# Patient Record
Sex: Male | Born: 1995
Health system: Southern US, Community
[De-identification: ages and names within clinical notes are randomized; demographics above are authoritative.]

## PROBLEM LIST (undated history)

## (undated) DIAGNOSIS — F988 Other specified behavioral and emotional disorders with onset usually occurring in childhood and adolescence: Secondary | ICD-10-CM

## (undated) DIAGNOSIS — F909 Attention-deficit hyperactivity disorder, unspecified type: Secondary | ICD-10-CM

## (undated) HISTORY — PX: APPENDECTOMY: SHX54

## (undated) HISTORY — PX: APPENDECTOMY (OPEN): SHX54

## (undated) HISTORY — DX: Attention-deficit hyperactivity disorder, unspecified type: F90.9

---

## 2017-07-24 DIAGNOSIS — Z5181 Encounter for therapeutic drug level monitoring: Secondary | ICD-10-CM | POA: Diagnosis not present

## 2017-07-24 DIAGNOSIS — Z79899 Other long term (current) drug therapy: Secondary | ICD-10-CM | POA: Diagnosis not present

## 2017-07-24 DIAGNOSIS — F988 Other specified behavioral and emotional disorders with onset usually occurring in childhood and adolescence: Secondary | ICD-10-CM | POA: Diagnosis not present

## 2017-08-21 DIAGNOSIS — F988 Other specified behavioral and emotional disorders with onset usually occurring in childhood and adolescence: Secondary | ICD-10-CM | POA: Diagnosis not present

## 2017-10-01 DIAGNOSIS — F988 Other specified behavioral and emotional disorders with onset usually occurring in childhood and adolescence: Secondary | ICD-10-CM | POA: Diagnosis not present

## 2017-10-01 DIAGNOSIS — Z5181 Encounter for therapeutic drug level monitoring: Secondary | ICD-10-CM | POA: Diagnosis not present

## 2018-01-08 DIAGNOSIS — Z5181 Encounter for therapeutic drug level monitoring: Secondary | ICD-10-CM | POA: Diagnosis not present

## 2018-01-08 DIAGNOSIS — F988 Other specified behavioral and emotional disorders with onset usually occurring in childhood and adolescence: Secondary | ICD-10-CM | POA: Diagnosis not present

## 2018-07-30 DIAGNOSIS — Z0000 Encounter for general adult medical examination without abnormal findings: Secondary | ICD-10-CM | POA: Diagnosis not present

## 2018-07-30 DIAGNOSIS — Z5181 Encounter for therapeutic drug level monitoring: Secondary | ICD-10-CM | POA: Diagnosis not present

## 2018-07-30 DIAGNOSIS — F988 Other specified behavioral and emotional disorders with onset usually occurring in childhood and adolescence: Secondary | ICD-10-CM | POA: Diagnosis not present

## 2018-08-06 DIAGNOSIS — Z0000 Encounter for general adult medical examination without abnormal findings: Secondary | ICD-10-CM | POA: Diagnosis not present

## 2018-08-06 DIAGNOSIS — F988 Other specified behavioral and emotional disorders with onset usually occurring in childhood and adolescence: Secondary | ICD-10-CM | POA: Diagnosis not present

## 2018-12-15 ENCOUNTER — Other Ambulatory Visit: Payer: Self-pay

## 2018-12-15 ENCOUNTER — Emergency Department (HOSPITAL_COMMUNITY)
Admission: EM | Admit: 2018-12-15 | Discharge: 2018-12-15 | Disposition: A | Discharge status: Home / Self Care | Payer: Federal, State, Local not specified - PPO | Attending: Emergency Medicine | Admitting: Emergency Medicine

## 2018-12-15 ENCOUNTER — Encounter (HOSPITAL_COMMUNITY): Payer: Self-pay

## 2018-12-15 ENCOUNTER — Emergency Department (HOSPITAL_COMMUNITY): Payer: Federal, State, Local not specified - PPO

## 2018-12-15 DIAGNOSIS — J111 Influenza due to unidentified influenza virus with other respiratory manifestations: Secondary | ICD-10-CM | POA: Insufficient documentation

## 2018-12-15 DIAGNOSIS — R05 Cough: Secondary | ICD-10-CM | POA: Diagnosis not present

## 2018-12-15 DIAGNOSIS — R509 Fever, unspecified: Secondary | ICD-10-CM | POA: Diagnosis not present

## 2018-12-15 HISTORY — DX: Other specified behavioral and emotional disorders with onset usually occurring in childhood and adolescence: F98.8

## 2018-12-15 MED ORDER — OSELTAMIVIR PHOSPHATE 75 MG PO CAPS: 75.0000 mg | ORAL_CAPSULE | Freq: Two times a day (BID) | ORAL | 0 refills | Status: AC

## 2018-12-15 MED ORDER — BENZONATATE 100 MG PO CAPS: 100.0000 mg | ORAL_CAPSULE | Freq: Three times a day (TID) | ORAL | 0 refills | Status: AC

## 2018-12-15 MED ORDER — IBUPROFEN 200 MG PO TABS
400.0000 mg | ORAL_TABLET | Freq: Once | ORAL | Status: AC
  Administered 2018-12-15: 400 mg via ORAL
  Filled 2018-12-15: qty 2

## 2018-12-15 MED ORDER — ALBUTEROL SULFATE HFA 108 (90 BASE) MCG/ACT IN AERS
2.0000 | INHALATION_SPRAY | RESPIRATORY_TRACT | Status: DC | PRN
  Administered 2018-12-15: 2 via RESPIRATORY_TRACT
  Filled 2018-12-15: qty 6.7

## 2018-12-15 NOTE — ED Provider Notes (Signed)
[facility]-EMERGENCY DEPT Provider Note   CSN: [id number] Arrival date & time: 12/15/18  1220     History   Chief Complaint Chief Complaint  Patient presents with  . Generalized Body Aches  . Cough  . Fever    HPI Todd Nicholson is a 22 y.o. male who presents to the ED with flu like symptoms. Patient reports the symptoms started 2 days ago and have gotten worse with fever, chills and cough. Patient taking OTC medications without relief. Patient did not have a flu vaccine this year.   The history is provided by the patient. No language interpreter was used.  Influenza  Presenting symptoms: cough, fever, headache, myalgias, nausea, rhinorrhea and sore throat   Presenting symptoms: no diarrhea, no shortness of breath and no vomiting   Severity:  Moderate Onset quality:  Gradual Duration:  2 days Progression:  Worsening Chronicity:  New Relieved by:  Nothing Worsened by:  Nothing Ineffective treatments:  OTC medications Associated symptoms: chills, decreased appetite, ear pain and nasal congestion   Associated symptoms: no neck stiffness     Past Medical History:  Diagnosis Date  . ADD (attention deficit disorder)     There are no active problems to display for this patient.   Past Surgical History:  Procedure Laterality Date  . APPENDECTOMY          Home Medications    Prior to Admission medications   Medication Sig Start Date End Date Taking? Authorizing Provider  benzonatate (TESSALON) 100 MG capsule Take 1 capsule (100 mg total) by mouth every 8 (eight) hours. 12/15/18   Janne NapoleonNeese, Shakela Donati M, NP  oseltamivir (TAMIFLU) 75 MG capsule Take 1 capsule (75 mg total) by mouth every 12 (twelve) hours. 12/15/18   Janne NapoleonNeese, Tamika Shropshire M, NP    Family History Family History  Problem Relation Age of Onset  . Healthy Mother   . Healthy Father     Social History Social History   Tobacco Use  . Smoking status: Never Smoker  . Smokeless tobacco: Never  Used  Substance Use Topics  . Alcohol use: Yes    Comment: occasionally  . Drug use: Yes    Types: Marijuana     Allergies   Patient has no known allergies.   Review of Systems Review of Systems  Constitutional: Positive for chills, decreased appetite and fever.  HENT: Positive for congestion, ear pain, rhinorrhea and sore throat.   Eyes: Negative for pain, discharge and itching.  Respiratory: Positive for cough and wheezing. Negative for shortness of breath.   Cardiovascular: Negative for chest pain.  Gastrointestinal: Positive for nausea. Negative for abdominal pain, diarrhea and vomiting.  Genitourinary: Negative for decreased urine volume, dysuria, frequency and urgency.  Musculoskeletal: Positive for myalgias. Negative for neck stiffness.  Skin: Negative for rash.  Neurological: Positive for headaches. Negative for syncope.  Hematological: Negative for adenopathy.  Psychiatric/Behavioral: Negative for confusion.     Physical Exam Updated Vital Signs BP 128/72 (BP Location: Left Arm)   Pulse 99   Temp (!) 100.9 F (38.3 C) (Oral)   Resp 18   Ht 5\' 11"  (1.803 m)   Wt 74.8 kg   SpO2 97%   BMI 23.01 kg/m   Physical Exam Vitals signs and nursing note reviewed.  Constitutional:      General: He is not in acute distress.    Appearance: He is well-developed.  HENT:     Head: Normocephalic and atraumatic.  Right Ear: Tympanic membrane normal.     Left Ear: Tympanic membrane normal.     Nose: Mucosal edema and congestion present.     Right Turbinates: Swollen.     Left Turbinates: Swollen.     Mouth/Throat:     Mouth: Mucous membranes are moist.     Pharynx: Oropharynx is clear. Uvula midline.  Neck:     Musculoskeletal: Neck supple.  Cardiovascular:     Rate and Rhythm: Normal rate and regular rhythm.  Pulmonary:     Effort: No respiratory distress.     Breath sounds: No rales. Wheezes: occasional.  Abdominal:     Palpations: Abdomen is soft.      Tenderness: There is no abdominal tenderness.  Musculoskeletal: Normal range of motion.  Skin:    General: Skin is warm and dry.  Neurological:     Mental Status: He is alert and oriented to person, place, and time.  Psychiatric:        Mood and Affect: Mood normal.      ED Treatments / Results  Labs (all labs ordered are listed, but only abnormal results are displayed) Labs Reviewed - No data to display  Radiology Dg Chest 2 View  Result Date: 12/15/2018 CLINICAL DATA:  Productive cough for 2-3 days, smoker, vaping EXAM: CHEST - 2 VIEW COMPARISON:  None FINDINGS: Normal heart size, mediastinal contours, and pulmonary vascularity. Lungs clear. No pleural effusion or pneumothorax. Bones unremarkable. IMPRESSION: Normal exam. Electronically Signed   By: Ulyses SouthwardMark  Boles M.D.   On: 12/15/2018 14:17    Procedures Procedures (including critical care time)  Medications Ordered in ED Medications  albuterol (PROVENTIL HFA;VENTOLIN HFA) 108 (90 Base) MCG/ACT inhaler 2 puff (2 puffs Inhalation Given 12/15/18 1308)  ibuprofen (ADVIL,MOTRIN) tablet 400 mg (400 mg Oral Given 12/15/18 1355)     Initial Impression / Assessment and Plan / ED Course  I have reviewed the triage vital signs and the nursing notes. SUBJECTIVE:  Todd Nicholson is a [age] y.o. male who present complaining of flu-like symptoms: fevers, chills, myalgias, congestion, sore throat and cough for 2 days. Denies dyspnea. CXR show no pneumonia or other acute findings.  OBJECTIVE: Appears moderately ill but not toxic; temperature as noted in vitals. Ears normal. Throat and pharynx normal.  Neck supple. No adenopathy in the neck. Sinuses non tender. The chest is clear.  ASSESSMENT: Influenza  PLAN: albuterol inhaler given prior to d/c to use 2 puffs every 4 hours as needed. Will start Tamiflu and Symptomatic therapy suggested: rest, increase fluids, tylenol and ibuprofen for fever and body aches, gargle prn for sore throat and  use mist of vaporizer prn. Follow up with PCP or return here for worsening symptoms.   Final Clinical Impressions(s) / ED Diagnoses   Final diagnoses:  Influenza    ED Discharge Orders         Ordered    benzonatate (TESSALON) 100 MG capsule  Every 8 hours     12/15/18 1443    oseltamivir (TAMIFLU) 75 MG capsule  Every 12 hours     12/15/18 1443           Damian Leavelleese, BerwynHope M, NP 12/15/18 1451    Lorre NickAllen, Anthony, MD 12/16/18 [phone]

## 2018-12-15 NOTE — Discharge Instructions (Addendum)
Take tylenol and ibuprofen as needed for fever and body aches. Follow up with your doctor or return here for worsening symptoms. Be sure you are drinking plenty of fluids to prevent dehydration.

## 2018-12-15 NOTE — ED Notes (Signed)
Bed: WTR8 Expected date:  Expected time:  Means of arrival:  Comments: 

## 2019-02-13 ENCOUNTER — Emergency Department
Admission: EM | Admit: 2019-02-13 | Discharge: 2019-02-13 | Disposition: A | Discharge status: Home / Self Care | Payer: BLUE CROSS/BLUE SHIELD | Attending: Emergency Medicine | Admitting: Emergency Medicine

## 2019-02-13 DIAGNOSIS — R112 Nausea with vomiting, unspecified: Secondary | ICD-10-CM | POA: Insufficient documentation

## 2019-02-13 DIAGNOSIS — R197 Diarrhea, unspecified: Secondary | ICD-10-CM | POA: Insufficient documentation

## 2019-02-13 DIAGNOSIS — E860 Dehydration: Secondary | ICD-10-CM | POA: Insufficient documentation

## 2019-02-13 LAB — CBC AND DIFFERENTIAL
Absolute NRBC: 0 10*3/uL (ref 0.00–0.00)
Basophils Absolute Automated: 0.05 10*3/uL (ref 0.00–0.08)
Basophils Automated: 0.4 %
Eosinophils Absolute Automated: 0.02 10*3/uL (ref 0.00–0.44)
Eosinophils Automated: 0.2 %
Hematocrit: 50.2 % — ABNORMAL HIGH (ref 37.6–49.6)
Hgb: 17.3 g/dL — ABNORMAL HIGH (ref 12.5–17.1)
Immature Granulocytes Absolute: 0.06 10*3/uL (ref 0.00–0.07)
Immature Granulocytes: 0.5 %
Lymphocytes Absolute Automated: 0.21 10*3/uL — ABNORMAL LOW (ref 0.42–3.22)
Lymphocytes Automated: 1.6 %
MCH: 30.5 pg (ref 25.1–33.5)
MCHC: 34.5 g/dL (ref 31.5–35.8)
MCV: 88.5 fL (ref 78.0–96.0)
MPV: 9.9 fL (ref 8.9–12.5)
Monocytes Absolute Automated: 0.55 10*3/uL (ref 0.21–0.85)
Monocytes: 4.1 %
Neutrophils Absolute: 12.42 10*3/uL — ABNORMAL HIGH (ref 1.10–6.33)
Neutrophils: 93.2 %
Nucleated RBC: 0 /100 WBC (ref 0.0–0.0)
Platelets: 234 10*3/uL (ref 142–346)
RBC: 5.67 10*6/uL (ref 4.20–5.90)
RDW: 12 % (ref 11–15)
WBC: 13.31 10*3/uL — ABNORMAL HIGH (ref 3.10–9.50)

## 2019-02-13 LAB — COMPREHENSIVE METABOLIC PANEL
ALT: 17 U/L (ref 0–55)
AST (SGOT): 16 U/L (ref 5–34)
Albumin/Globulin Ratio: 1.6 (ref 0.9–2.2)
Albumin: 5 g/dL (ref 3.5–5.0)
Alkaline Phosphatase: 90 U/L (ref 38–106)
Anion Gap: 12 (ref 5.0–15.0)
BUN: 17 mg/dL (ref 9.0–28.0)
Bilirubin, Total: 1.2 mg/dL (ref 0.2–1.2)
CO2: 24 mEq/L (ref 22–29)
Calcium: 10.4 mg/dL (ref 8.5–10.5)
Chloride: 105 mEq/L (ref 100–111)
Creatinine: 1.1 mg/dL (ref 0.7–1.3)
Globulin: 3.2 g/dL (ref 2.0–3.6)
Glucose: 126 mg/dL — ABNORMAL HIGH (ref 70–100)
Potassium: 4.6 mEq/L (ref 3.5–5.1)
Protein, Total: 8.2 g/dL (ref 6.0–8.3)
Sodium: 141 mEq/L (ref 136–145)

## 2019-02-13 LAB — GFR: EGFR: >60

## 2019-02-13 LAB — LIPASE: Lipase: 12 U/L (ref 8–78)

## 2019-02-13 LAB — HEMOLYSIS INDEX: Hemolysis Index: 13 (ref 0–18)

## 2019-02-13 MED ORDER — ONDANSETRON 4 MG PO TBDP
4.00 mg | ORAL_TABLET | Freq: Four times a day (QID) | ORAL | 0 refills | Status: AC | PRN
Start: 2019-02-13 — End: ?

## 2019-02-13 MED ORDER — LIDOCAINE VISCOUS HCL 2 % MT SOLN
10.00 mL | Freq: Once | OROMUCOSAL | Status: AC
Start: 2019-02-13 — End: 2019-02-13
  Administered 2019-02-13: 05:00:00 10 mL via OROMUCOSAL
  Filled 2019-02-13: qty 15

## 2019-02-13 MED ORDER — ALUM & MAG HYDROXIDE-SIMETH 200-200-20 MG/5ML PO SUSP
30.00 mL | Freq: Once | ORAL | Status: AC
Start: 2019-02-13 — End: 2019-02-13
  Administered 2019-02-13: 05:00:00 30 mL via ORAL
  Filled 2019-02-13: qty 30

## 2019-02-13 MED ORDER — FAMOTIDINE 20 MG PO TABS
20.0000 mg | ORAL_TABLET | Freq: Once | ORAL | Status: AC
Start: 2019-02-13 — End: 2019-02-13
  Administered 2019-02-13: 05:00:00 20 mg via ORAL
  Filled 2019-02-13: qty 1

## 2019-02-13 MED ORDER — SODIUM CHLORIDE 0.9 % IV BOLUS
1000.00 mL | Freq: Once | INTRAVENOUS | Status: AC
Start: 2019-02-13 — End: 2019-02-13
  Administered 2019-02-13: 04:00:00 1000 mL via INTRAVENOUS

## 2019-02-13 MED ORDER — KETOROLAC TROMETHAMINE 30 MG/ML IJ SOLN
30.00 mg | Freq: Once | INTRAMUSCULAR | Status: AC
Start: 2019-02-13 — End: 2019-02-13
  Administered 2019-02-13: 04:00:00 30 mg via INTRAVENOUS
  Filled 2019-02-13: qty 1

## 2019-02-13 MED ORDER — METOCLOPRAMIDE HCL 5 MG/ML IJ SOLN
10.00 mg | Freq: Once | INTRAMUSCULAR | Status: AC
Start: 2019-02-13 — End: 2019-02-13
  Administered 2019-02-13: 05:00:00 10 mg via INTRAVENOUS
  Filled 2019-02-13: qty 2

## 2019-02-13 MED ORDER — ONDANSETRON HCL 4 MG/2ML IJ SOLN
4.00 mg | Freq: Once | INTRAMUSCULAR | Status: AC
Start: 2019-02-13 — End: 2019-02-13
  Administered 2019-02-13: 04:00:00 4 mg via INTRAVENOUS
  Filled 2019-02-13: qty 2

## 2019-02-13 NOTE — ED Provider Notes (Signed)
EMERGENCY DEPARTMENT HISTORY AND PHYSICAL EXAM     Physician/Midlevel provider first contact with patient: 02/13/19 0341         Date: 02/13/2019  Patient Name: Paul Winters  Attending Physician: Darcus Pester, MD  Advanced Practice Provider: Lynita Lombard    History of Presenting Illness       History Provided By: Patient    Chief Complaint:  Chief Complaint   Patient presents with    Abdominal Pain    Diarrhea    Emesis     Onset: 2 hours after eating Chipotle  Timing: Sudden GI  Location: GI  Quality: Nausea vomiting diarrhea  Severity: moderate  Exacerbating factors: n/a  Alleviating factors: n/a  Associated Symptoms: see ROS  Pertinent Negatives: see ROS    Additional History: Paul Winters is a 23 y.o. male presenting to the ED with history of appendectomy presents with nausea vomiting diarrhea after eating triple today.  Patient has no abdominal pain at this time.  He was started on fluids given Toradol feeling a lot better no fevers or chills.  No urinary symptoms no scrotal pain.  States that he loves chipolte and never gets sick like this before    PCP: Pcp, None, MD  SPECIALISTS:    No current facility-administered medications for this encounter.      Current Outpatient Medications   Medication Sig Dispense Refill    ondansetron (ZOFRAN-ODT) 4 MG disintegrating tablet Take 1 tablet (4 mg total) by mouth every 6 (six) hours as needed for Nausea 8 tablet 0       Past History     Past Medical History:  Past Medical History:   Diagnosis Date    ADHD        Past Surgical History:  Past Surgical History:   Procedure Laterality Date    APPENDECTOMY         Family History:  No family history on file.    Social History:  Social History     Socioeconomic History    Marital status: Single     Spouse name: None    Number of children: None    Years of education: None    Highest education level: None   Occupational History    None   Social Engineer, site strain: None     Food insecurity:     Worry: None     Inability: None    Transportation needs:     Medical: None     Non-medical: None   Tobacco Use    Smoking status: Never Smoker    Smokeless tobacco: Never Used   Substance and Sexual Activity    Alcohol use: None    Drug use: None    Sexual activity: None   Lifestyle    Physical activity:     Days per week: None     Minutes per session: None    Stress: None   Relationships    Social connections:     Talks on phone: None     Gets together: None     Attends religious service: None     Active member of club or organization: None     Attends meetings of clubs or organizations: None     Relationship status: None    Intimate partner violence:     Fear of current or ex partner: None     Emotionally abused: None     Physically abused: None  Forced sexual activity: None   Other Topics Concern    None   Social History Narrative    None       Allergies:  No Known Allergies    Review of Systems       General: No fever, no sweats, no chills.   HENT: No headache, no cold symptoms.  Respiratory: No cough, no shortness of breath.  Cardiovascular: No chest pain, no calf pain.   Gastrointestinal: no abdominal pain, + nausea, + vomiting, + diarrhea, no constipation.   Genito-Urinary: No dysuria, no frequency, no hematuria  Musculoskeletal: No back pain, no other joint pains.    Neurological: No new focal weakness, no sensory changes.   Dermatological: No new rash, no color changes.   Psychological: No acute mood changes, no confusion.     Review of Systems    Physical Exam     Vitals:    02/13/19 0333 02/13/19 0542   BP: 115/72 113/57   Pulse: 96 86   Resp: 18 12   Temp: 98.4 F (36.9 C) 98 F (36.7 C)   TempSrc:  Oral   SpO2: 100% 98%   Weight: 77.1 kg    Height: 5\' 11"  (1.803 m)          Constitutional: Vital signs reviewed. Well appearing, no apparent distress.  Head: Normocephalic, atraumatic. No external trauma noted.  Eyes: Conjunctiva and sclera are normal. Pupils equal,  round.  Ear, Nose, Throat:  Normal external examination of the nose and ears. Oropharynx clear, moist mucous membranes.   Neck: Supple.   Respiratory: Clear to auscultation. No respiratory distress. No wheezing, rhonchi or rales.  Cardiovascular: Regular rate. Regular rhythm. S1, S2.   Abdomen: Normoactive Bowel sounds. Soft. non tenderness, otherwise non-tender to palpation. No guarding or rebound.    Back: No midline tenderness, no CVA tenderness.   Extremities: Upper and lower extremities with no cyanosis or edema. No calf tenderness.   Skin: Warm and dry. No rash.  Neuro: alert and appropriate, normal speech, no facial droop, moving all extremities.     Physical Exam    Diagnostic Study Results     Labs -     Results     Procedure Component Value Units Date/Time    Rapid influenza A/B antigens [[id number]] Collected:  02/13/19 0543    Specimen:  Nasopharyngeal from Nasal Aspirate Updated:  02/13/19 0619    Narrative:       ORDER#: [id number]                                    ORDERED BY: Lysle Pearl  SOURCE: Nasal Aspirate                               COLLECTED:  02/13/19 05:43  ANTIBIOTICS AT COLL.:                                RECEIVED :  02/13/19 06:00  Influenza Rapid Antigen A&B                FINAL       02/13/19 06:19  02/13/19   Negative for Influenza A and B             Reference Range: Negative      CBC  and differential [[id number]]  (Abnormal) Collected:  02/13/19 0403    Specimen:  Blood Updated:  02/13/19 0438     WBC 13.31 x10 3/uL      Hgb 17.3 g/dL      Hematocrit 40.9 %      Platelets 234 x10 3/uL      RBC 5.67 x10 6/uL      MCV 88.5 fL      MCH 30.5 pg      MCHC 34.5 g/dL      RDW 12 %      MPV 9.9 fL      Neutrophils 93.2 %      Lymphocytes Automated 1.6 %      Monocytes 4.1 %      Eosinophils Automated 0.2 %      Basophils Automated 0.4 %      Immature Granulocyte 0.5 %      Nucleated RBC 0.0 /100 WBC      Neutrophils Absolute 12.42 x10 3/uL      Abs Lymph Automated 0.21 x10 3/uL      Abs  Mono Automated 0.55 x10 3/uL      Abs Eos Automated 0.02 x10 3/uL      Absolute Baso Automated 0.05 x10 3/uL      Absolute Immature Granulocyte 0.06 x10 3/uL      Absolute NRBC 0.00 x10 3/uL     Comprehensive metabolic panel [[id number]]  (Abnormal) Collected:  02/13/19 0403    Specimen:  Blood Updated:  02/13/19 0437     Glucose 126 mg/dL      BUN 95.6 mg/dL      Creatinine 1.1 mg/dL      Sodium 213 mEq/L      Potassium 4.6 mEq/L      Chloride 105 mEq/L      CO2 24 mEq/L      Calcium 10.4 mg/dL      Protein, Total 8.2 g/dL      Albumin 5.0 g/dL      AST (SGOT) 16 U/L      ALT 17 U/L      Alkaline Phosphatase 90 U/L      Bilirubin, Total 1.2 mg/dL      Globulin 3.2 g/dL      Albumin/Globulin Ratio 1.6     Anion Gap 12.0    Lipase [[id number]] Collected:  02/13/19 0403    Specimen:  Blood Updated:  02/13/19 0437     Lipase 12 U/L     Hemolysis index [[id number]] Collected:  02/13/19 0403     Updated:  02/13/19 0437     Hemolysis Index 13    GFR [[id number]] Collected:  02/13/19 0403     Updated:  02/13/19 0437     EGFR >60.0          Radiologic Studies -   Radiology Results (24 Hour)     ** No results found for the last 24 hours. **      .    Medical Decision Making   I am the first provider for this patient.    I reviewed the vital signs, available nursing notes, past medical history, past surgical history, family history and social history.    Vital Signs-Reviewed the patient's vital signs.     Patient Vitals for the past 12 hrs:   BP Temp Pulse Resp   02/13/19 0542 113/57 98 F (36.7 C) 86 12   02/13/19 0333 115/72 98.4 F (36.9 C) 96 18  Pulse Oximetry Analysis - Normal 100% on RA      Procedures:   Procedures        Old Medical Records: Old medical records.     ED Course:        Provider Notes/Medical Decision Making:    23 y.o. male with history of appendectomy presents with vomiting and diarrhea with benign abdomen after eating triple day, do not think acute pancreatitis, cholecystitis, SBO, appendicitis,  or colitis at this time.     Pt given symptomatic relief and was able to tolerate PO. Suspect most likely gastritis, gastroenteritis, acute diarrhea at this time. Will treat symptomatically  .   Elevated white count reactive no fever.  Patient has high hemoglobin due to hemoconcentration and dehydration.    Based on the history and physical, re-evaluations and testing do not feel the patient has an acute surgical abdominal issue at this time. Pt instructed to return if worsening abdominal pain, fever, persistent vomiting, or any other concerns and otherwise follow up with primary doctor or specialist.       Discharge Prescriptions     Medication Sig Dispense Auth. Provider    ondansetron (ZOFRAN-ODT) 4 MG disintegrating tablet Take 1 tablet (4 mg total) by mouth every 6 (six) hours as needed for Nausea 8 tablet Lynita Lombard, PA          D/w Ahmed, Rochel Brome, MD      Diagnosis     Clinical Impression:   1. Nausea vomiting and diarrhea    2. Dehydration        Treatment Plan:   ED Disposition     ED Disposition Condition Date/Time Comment    Discharge  Fri Feb 13, 2019  6:40 AM Paul Winters discharge to home/self care.    Condition at disposition: Stable            _______________________________    CHART OWNERSHIPNigel Sloop, PA-C, am the primary clinician of record.  _______________________________       Lynita Lombard, PA  02/13/19 0640       Lynita Lombard, PA  02/13/19 0102       Darcus Pester, MD  02/13/19 [phone]

## 2019-02-13 NOTE — ED Triage Notes (Signed)
Paul Winters is a 23 y.o. male c/o diffuse abdominal pain that started two hours after eating Chipotle last night.Pt reports N/V/D and denies any medication PTA. BP 115/72    Pulse 96    Temp 98.4 F (36.9 C)    Resp 18    Ht 5\' 11"  (1.803 m)    Wt 77.1 kg    SpO2 100%    BMI 23.71 kg/m

## 2019-02-13 NOTE — Discharge Instructions (Signed)
Diarrhea    You have been diagnosed with diarrhea.    You have diarrhea when you have stools (bowel movements) that are soft or liquid, or when you have too many stools in a day. You may also have stomach cramps/ pains, nausea (feeling sick to your stomach), vomiting and fever (temperature higher than 100.65F / 38C).    Diarrhea can be caused by bacteria, viruses and parasites. People sometimes get traveler's diarrhea. They get it when they go to other countries. It is caused by bacteria.    People with diarrhea often lose a lot of body fluids. This causes dehydration. Drink a lot of water or other fluids to stay hydrated. You may also be given medicine for your diarrhea. It will slow the diarrhea and stop the vomiting (if you have this symptom).    You should drink lots of natural juices or a sports-type drink that has electrolytes (sodium, potassium, etc). Do not drink a lot of plain water. Sugary drinks like apple and pear juice might make the diarrhea worse.    You might be given medicine to help you with your diarrhea, nausea (feeling sick), vomiting and stomach cramps. This will depend on your age, medical history and symptoms.    It is OK for you to go home today.    Good hygiene (keeping clean) helps to keep the problem from spreading. Please wash your hands often, using soap and water, especially after using the bathroom. Do not prepare food or share food, drinks or utensils (forks, knives, etc.) with other people.    It is very important that you follow up with your regular doctor or a specialist. The doctor will tell you how soon this follow-up needs to be.    YOU SHOULD SEEK MEDICAL ATTENTION IMMEDIATELY, EITHER HERE OR AT THE NEAREST EMERGENCY DEPARTMENT, IF ANY OF THE FOLLOWING OCCURS:   You are vomiting and cannot keep down fluids.   There is blood, pus or mucous in your stool.   You have symptoms of dehydration. These include dry mouth, not urinating (peeing) at least once every eight  hours, feeling dizzy/lightheaded, severe weakness or passing out.               Dehydration    You have been diagnosed with dehydration.    Dehydration is when your body is low on fluids (liquids). Dehydration has a variety of causes. These range from vomiting and diarrhea, to excessive (a lot of) sweating and poor appetite.    You have received intravenous (IV) fluids. These are to help fix your dehydration. It is important to keep hydrating yourself at home. Drink plenty of fluids frequently. Drink fluids that won't upset your stomach. This includes water and juice. It also includes drinks like Gatorade. Stay away from beverages like soda pop and tea. They may make you worse. Avoid caffeine and alcohol. They may cause you to lose more fluid.    YOU SHOULD SEEK MEDICAL ATTENTION IMMEDIATELY, EITHER HERE OR AT THE NEAREST EMERGENCY DEPARTMENT, IF ANY OF THE FOLLOWING OCCURS:   Fever (temperature higher than 100.65F / 38C) or shaking chills.   Constant vomiting and/or diarrhea.   Lightheadedness or fainting.   If you do not urinate (pee) for 8 or more hours.               Nausea    You have been seen for nausea.    Nausea is the feeling that you are going to vomit (throw up). Nausea  is not a disease. It is a symptom of another problem. For example, nausea, vomiting and diarrhea are symptoms of a stomach virus (the "stomach flu").    You may or may not vomit when you have nausea.     The nausea itself is not dangerous but it can be very uncomfortable.    We might not be able to find out today what is causing your nausea. It is VERY IMPORTANT to see your doctor who can watch for any serious problems.    There are many treatments for nausea. The medical staff will discuss these with you. Some common medicines used to help with nausea are   Promethazine (Phenergan), prochlorperazine (Compazine), metoclopramide (Reglan), ondansetron (Zofran), and many others.    Follow a clear liquid diet. Drink  water, broth, 7-Up, Sprite, or other clear caffeine-free soft drinks or sports drinks until you feel better. This might help the nausea and keep you from vomiting.     If your nausea lasts for longer than a few days, or if you have new symptoms, we STRONGLY RECOMMEND that you go to see your family doctor, specialist or clinic. If you cannot get an appointment or do not have a doctor you can always return here or go to the nearest emergency department to be seen again.    YOU SHOULD SEEK MEDICAL ATTENTION IMMEDIATELY, EITHER HERE OR AT THE NEAREST EMERGENCY DEPARTMENT, IF ANY OF THE FOLLOWING OCCURS:   You vomit often.   You have abdominal (belly) pain.   You vomit blood or anything that looks like coffee grounds.   You have a headache.   You have any new symptoms or concerns.   You feel more unwell.               Vomiting    You have been seen for vomiting.    Vomiting (throwing-up) can be caused by many different things. Most of the time the cause IS NOT serious. The doctor feels it is OK for you to go home today.    Common causes of vomiting include the following:   Gastroenteritis (stomach flu), usually with diarrhea.   Other illnesses. Sometimes medical conditions like diabetes, heart problems, headaches, or infections can make someone throw up.    Bowel obstructions (blockages) can cause vomiting and make patients unable to have bowel movements (stool) or pass gas.   Vomiting can be a symptom of appendicitis, especially if there is also pain in the right lower abdomen (belly).    Sometimes it is hard to find out what is causing the vomiting. Vomiting can be treated with anti-nausea medicines like promethazine (Phenergan), prochlorperazine (Compazine) or ondansetron (Zofran).    Try to drink liquids to avoid dehydration. Dont drink a lot of fluid all at once. Take small sips throughout the day.    YOU SHOULD SEEK MEDICAL ATTENTION IMMEDIATELY, EITHER HERE OR AT THE NEAREST EMERGENCY  DEPARTMENT, IF ANY OF THE FOLLOWING OCCURS:   You cant stop vomiting or your vomiting doesnt get better with medication.   You cannot keep liquids down.   You have severe sudden chest or belly pain after vomiting.   You have abdominal pain.

## 2019-07-07 DIAGNOSIS — Z1159 Encounter for screening for other viral diseases: Secondary | ICD-10-CM | POA: Diagnosis not present

## 2019-12-17 IMAGING — CR DG CHEST 2V
2 series · 2 of 2 positions shown · non-contrast
Comparison: None

CLINICAL DATA: Productive cough for 2-3 days, smoker, vaping

EXAM:
CHEST - 2 VIEW

[w chest pa]
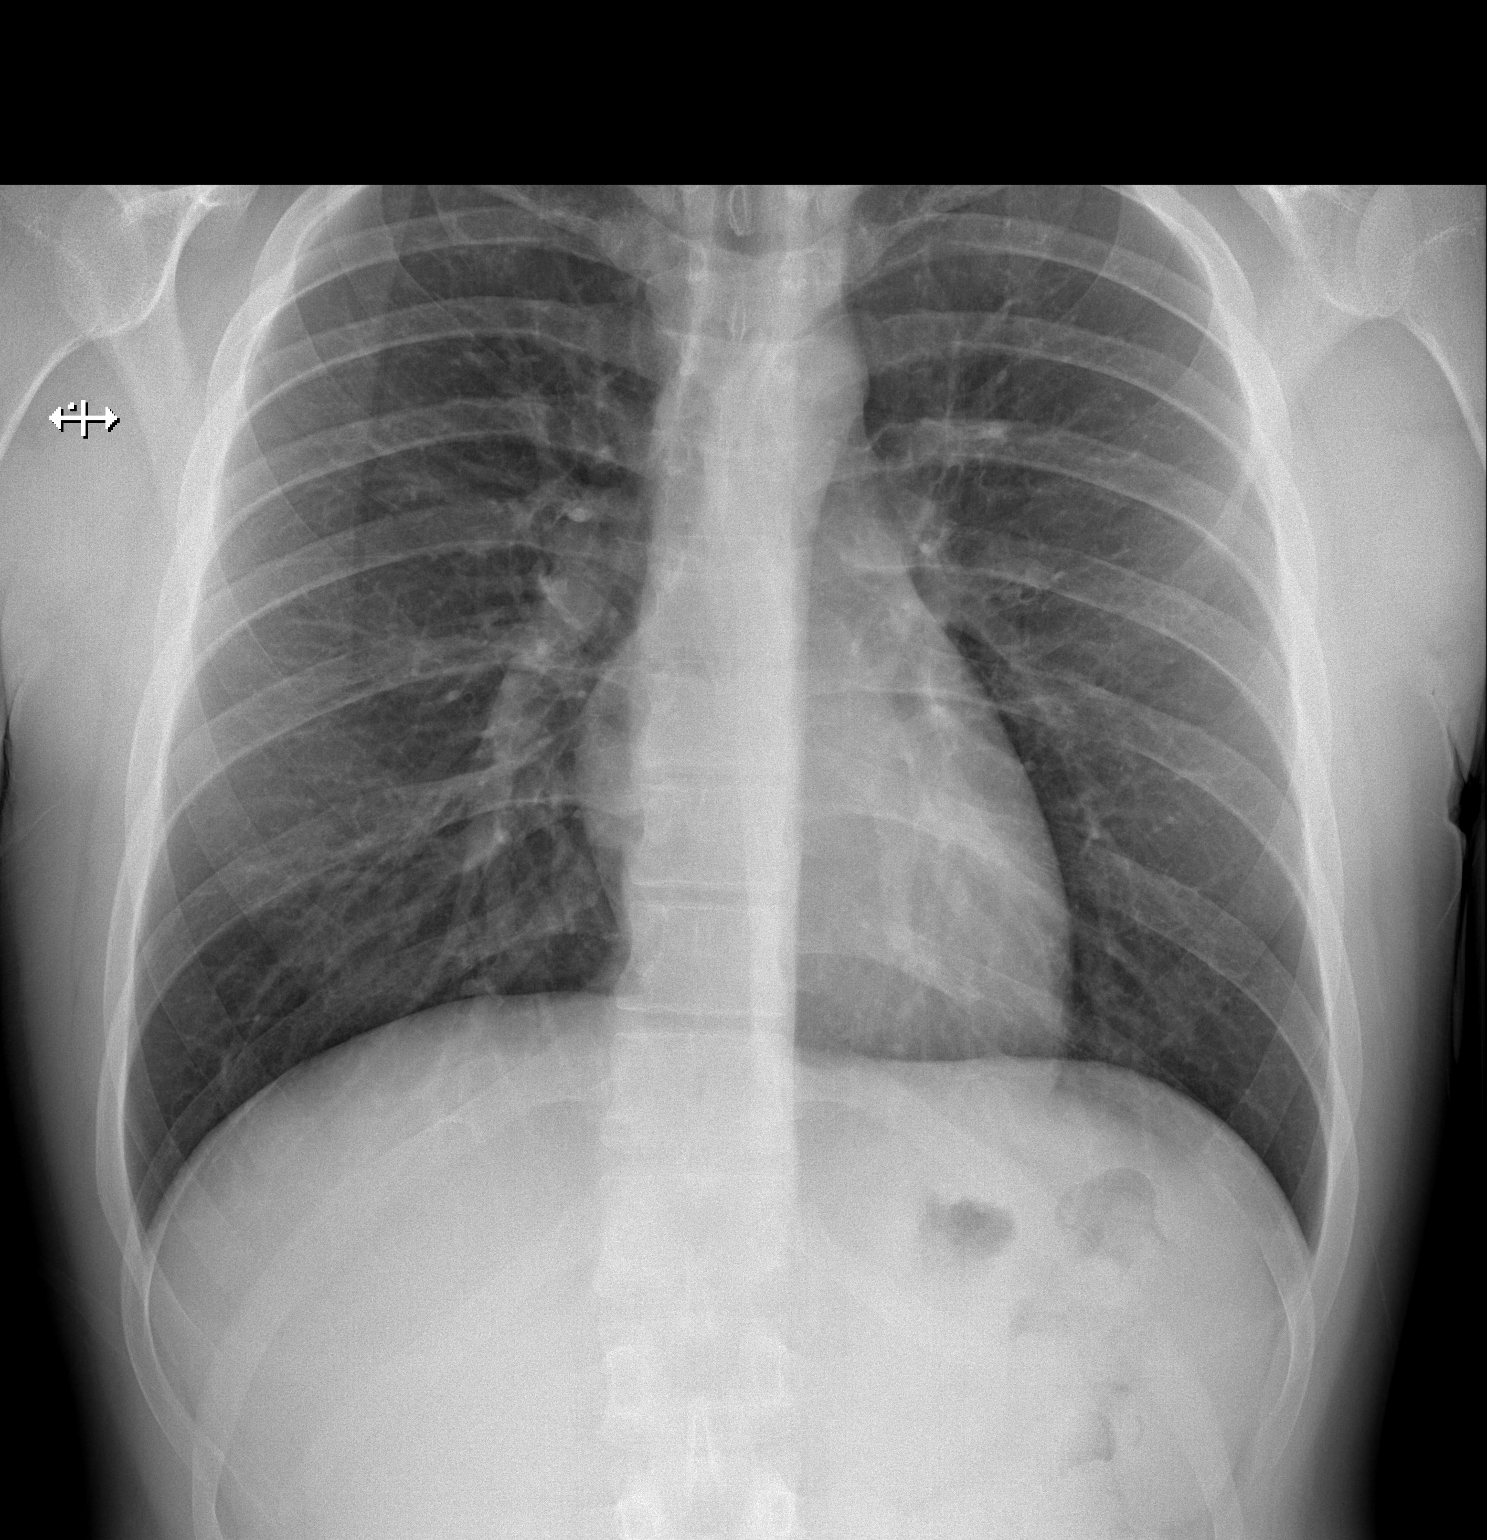

[w chest lat]
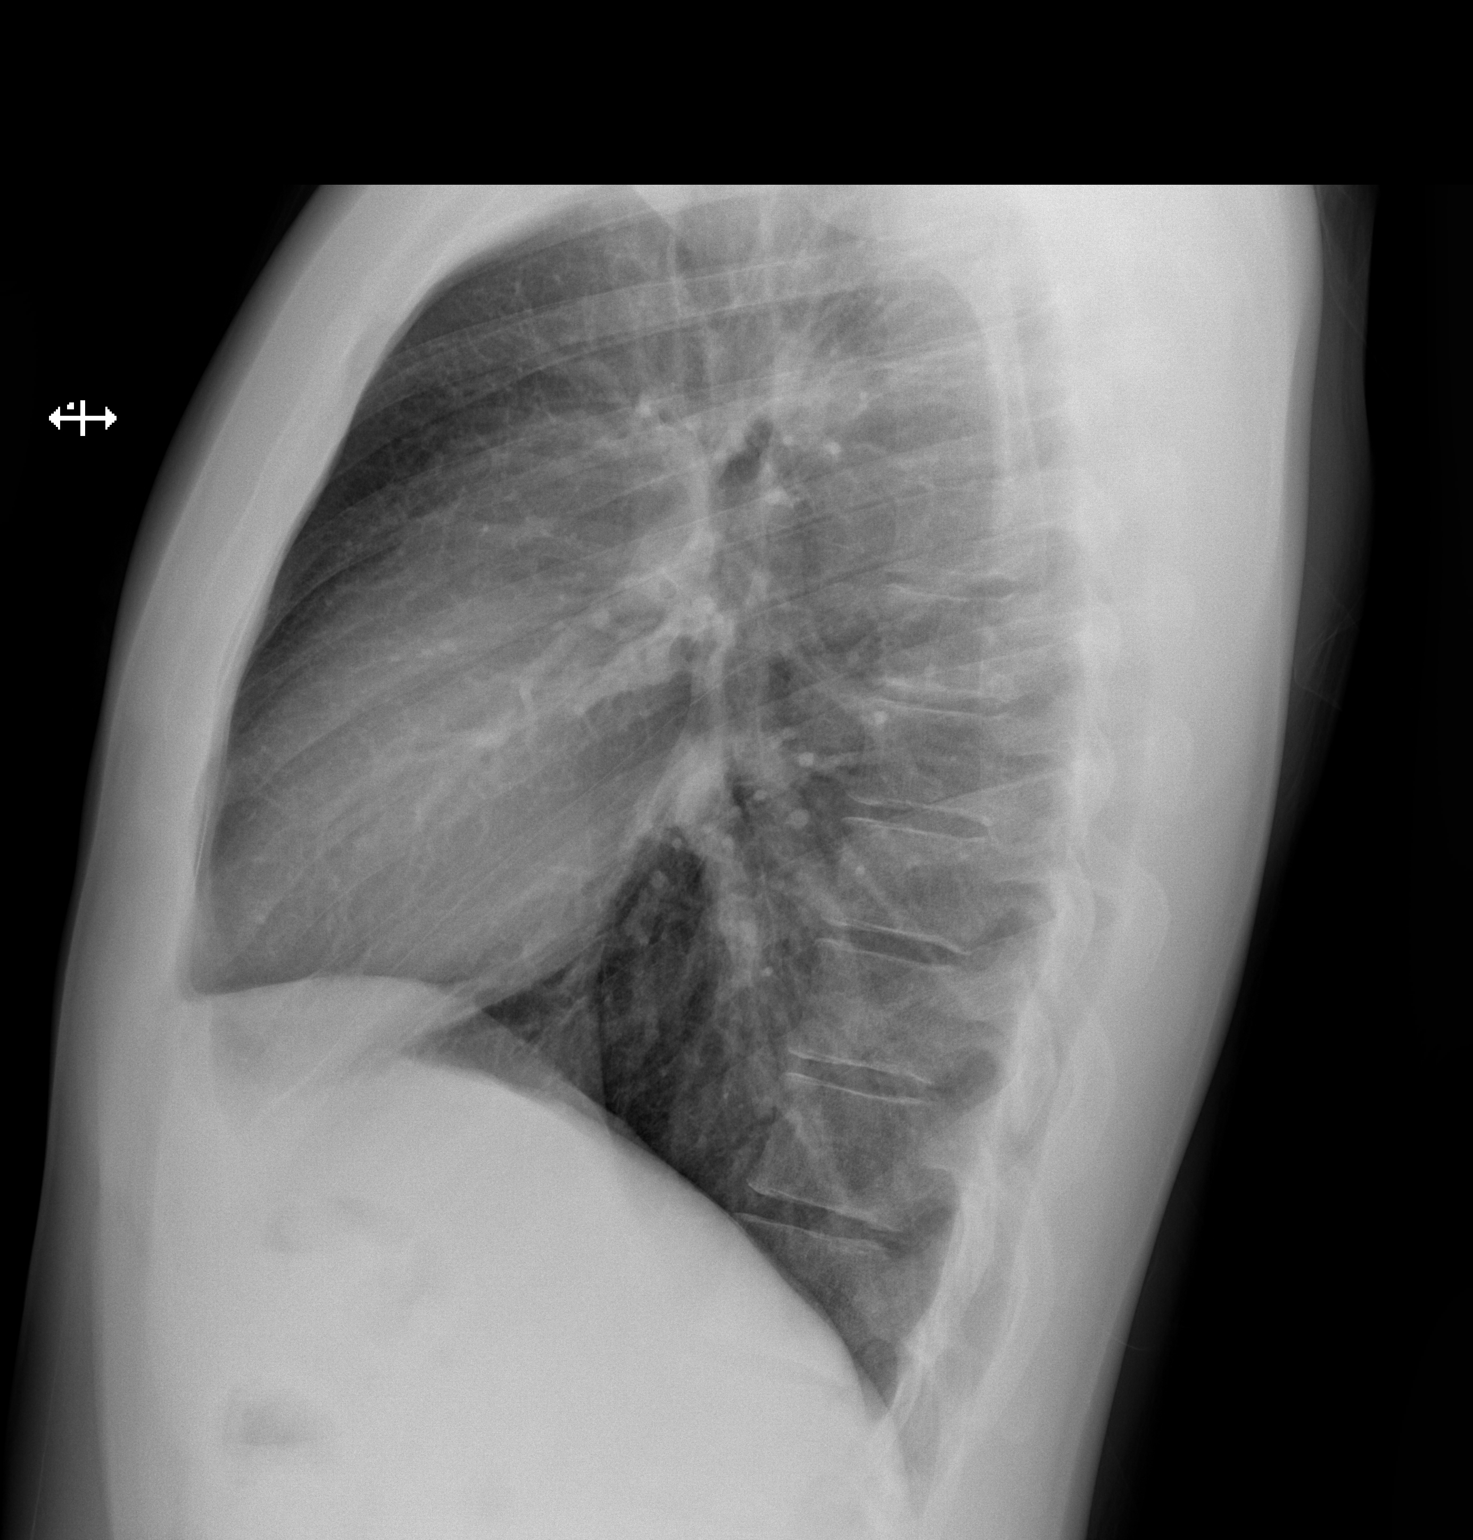

[2 of 2 positions shown; findings below may reference images not displayed]

FINDINGS: Normal heart size, mediastinal contours, and pulmonary vascularity.

Lungs clear.

No pleural effusion or pneumothorax.

Bones unremarkable.
IMPRESSION: Normal exam.

## 2020-04-19 DIAGNOSIS — Z03818 Encounter for observation for suspected exposure to other biological agents ruled out: Secondary | ICD-10-CM | POA: Diagnosis not present

## 2020-04-21 DIAGNOSIS — Z03818 Encounter for observation for suspected exposure to other biological agents ruled out: Secondary | ICD-10-CM | POA: Diagnosis not present
